# Patient Record
Sex: Female | Born: 1980 | Race: White | Hispanic: No | Marital: Married | Smoking: Never smoker
Health system: Southern US, Community
[De-identification: ages and names within clinical notes are randomized; demographics above are authoritative.]

## PROBLEM LIST (undated history)

## (undated) DIAGNOSIS — Z8619 Personal history of other infectious and parasitic diseases: Secondary | ICD-10-CM

## (undated) DIAGNOSIS — R51 Headache: Secondary | ICD-10-CM

## (undated) DIAGNOSIS — R519 Headache, unspecified: Secondary | ICD-10-CM

## (undated) HISTORY — DX: Personal history of other infectious and parasitic diseases: Z86.19

## (undated) HISTORY — DX: Headache: R51

## (undated) HISTORY — DX: Headache, unspecified: R51.9

## (undated) HISTORY — PX: EYE MUSCLE SURGERY: SHX370

## (undated) HISTORY — PX: WISDOM TOOTH EXTRACTION: SHX21

---

## 2014-01-16 LAB — OB RESULTS CONSOLE GC/CHLAMYDIA
CHLAMYDIA, DNA PROBE: NEGATIVE
Chlamydia: NEGATIVE
GC PROBE AMP, GENITAL: NEGATIVE
Gonorrhea: NEGATIVE

## 2014-01-16 LAB — OB RESULTS CONSOLE HIV ANTIBODY (ROUTINE TESTING)
HIV: NONREACTIVE
HIV: NONREACTIVE

## 2014-01-16 LAB — OB RESULTS CONSOLE RUBELLA ANTIBODY, IGM: Rubella: IMMUNE

## 2014-01-16 LAB — OB RESULTS CONSOLE RPR
RPR: NONREACTIVE
RPR: NONREACTIVE

## 2014-01-16 LAB — OB RESULTS CONSOLE ABO/RH: RH Type: POSITIVE

## 2014-01-16 LAB — OB RESULTS CONSOLE ANTIBODY SCREEN: Antibody Screen: NEGATIVE

## 2014-01-16 LAB — OB RESULTS CONSOLE HEPATITIS B SURFACE ANTIGEN: Hepatitis B Surface Ag: NEGATIVE

## 2014-04-17 NOTE — L&D Delivery Note (Signed)
SVD of VFI at 0656 on 07/28/14.  EBL 300cc.  APGARs 9,10.  Placenta to L&D. Head delivered LOA with loose nuchal x 2 reduced.  Body delivered atraumatically.  Cord clamped, cut and baby to abdomen.  Cord blood was obtained.  Placenta delivered S/I/3VC.  Fundus was firmed with pitocin and massage.  2nd degree perineal laceration repaired with 3-0 Rapide in the normal fashion.  Mom and baby stable.  Mitchel HonourMegan Christin Mccreedy, DO

## 2014-07-24 ENCOUNTER — Telehealth (HOSPITAL_COMMUNITY): Payer: Self-pay | Admitting: *Deleted

## 2014-07-24 ENCOUNTER — Encounter (HOSPITAL_COMMUNITY): Payer: Self-pay | Admitting: *Deleted

## 2014-07-24 NOTE — Telephone Encounter (Signed)
Preadmission screen  

## 2014-07-26 ENCOUNTER — Inpatient Hospital Stay (HOSPITAL_COMMUNITY): Admission: AD | Admit: 2014-07-26 | Payer: Self-pay | Source: Ambulatory Visit | Admitting: Obstetrics & Gynecology

## 2014-07-27 ENCOUNTER — Inpatient Hospital Stay (HOSPITAL_COMMUNITY): Payer: 59 | Admitting: Anesthesiology

## 2014-07-27 ENCOUNTER — Encounter (HOSPITAL_COMMUNITY): Payer: Self-pay

## 2014-07-27 ENCOUNTER — Inpatient Hospital Stay (HOSPITAL_COMMUNITY)
Admission: RE | Admit: 2014-07-27 | Discharge: 2014-07-29 | DRG: 775 | Disposition: A | Discharge status: Home / Self Care | Payer: 59 | Source: Ambulatory Visit | Attending: Obstetrics & Gynecology | Admitting: Obstetrics & Gynecology

## 2014-07-27 DIAGNOSIS — O480 Post-term pregnancy: Principal | ICD-10-CM | POA: Diagnosis present

## 2014-07-27 DIAGNOSIS — Z3403 Encounter for supervision of normal first pregnancy, third trimester: Secondary | ICD-10-CM | POA: Diagnosis present

## 2014-07-27 DIAGNOSIS — O09513 Supervision of elderly primigravida, third trimester: Secondary | ICD-10-CM | POA: Diagnosis not present

## 2014-07-27 DIAGNOSIS — Z3490 Encounter for supervision of normal pregnancy, unspecified, unspecified trimester: Secondary | ICD-10-CM

## 2014-07-27 DIAGNOSIS — Z3A40 40 weeks gestation of pregnancy: Secondary | ICD-10-CM | POA: Diagnosis present

## 2014-07-27 LAB — CBC
HCT: 35.2 % — ABNORMAL LOW (ref 36.0–46.0)
Hemoglobin: 12.1 g/dL (ref 12.0–15.0)
MCH: 27.4 pg (ref 26.0–34.0)
MCHC: 34.4 g/dL (ref 30.0–36.0)
MCV: 79.8 fL (ref 78.0–100.0)
PLATELETS: 218 10*3/uL (ref 150–400)
RBC: 4.41 MIL/uL (ref 3.87–5.11)
RDW: 15.1 % (ref 11.5–15.5)
WBC: 10 10*3/uL (ref 4.0–10.5)

## 2014-07-27 LAB — OB RESULTS CONSOLE GBS: STREP GROUP B AG: NEGATIVE

## 2014-07-27 LAB — TYPE AND SCREEN
ABO/RH(D): A POS
Antibody Screen: NEGATIVE

## 2014-07-27 LAB — ABO/RH: ABO/RH(D): A POS

## 2014-07-27 MED ORDER — LACTATED RINGERS IV SOLN
500.0000 mL | Freq: Once | INTRAVENOUS | Status: AC
  Administered 2014-07-27: 500 mL via INTRAVENOUS

## 2014-07-27 MED ORDER — PHENYLEPHRINE 40 MCG/ML (10ML) SYRINGE FOR IV PUSH (FOR BLOOD PRESSURE SUPPORT)
80.0000 ug | PREFILLED_SYRINGE | INTRAVENOUS | Status: DC | PRN
  Filled 2014-07-27: qty 2

## 2014-07-27 MED ORDER — OXYCODONE-ACETAMINOPHEN 5-325 MG PO TABS: 1.0000 | ORAL_TABLET | ORAL | Status: DC | PRN

## 2014-07-27 MED ORDER — FENTANYL 2.5 MCG/ML BUPIVACAINE 1/10 % EPIDURAL INFUSION (WH - ANES)
INTRAMUSCULAR | Status: DC | PRN
  Administered 2014-07-27: 15 mL/h via EPIDURAL

## 2014-07-27 MED ORDER — OXYTOCIN BOLUS FROM INFUSION
500.0000 mL | INTRAVENOUS | Status: DC
  Administered 2014-07-28: 500 mL via INTRAVENOUS

## 2014-07-27 MED ORDER — FLEET ENEMA 7-19 GM/118ML RE ENEM: 1.0000 | ENEMA | RECTAL | Status: DC | PRN

## 2014-07-27 MED ORDER — TERBUTALINE SULFATE 1 MG/ML IJ SOLN: 0.2500 mg | Freq: Once | INTRAMUSCULAR | Status: AC | PRN

## 2014-07-27 MED ORDER — LIDOCAINE HCL (PF) 1 % IJ SOLN
INTRAMUSCULAR | Status: DC | PRN
  Administered 2014-07-27: 4 mL
  Administered 2014-07-27: 5 mL

## 2014-07-27 MED ORDER — EPHEDRINE 5 MG/ML INJ
10.0000 mg | INTRAVENOUS | Status: DC | PRN
Start: 2014-07-27 — End: 2014-07-28
  Filled 2014-07-27: qty 2

## 2014-07-27 MED ORDER — OXYCODONE-ACETAMINOPHEN 5-325 MG PO TABS: 2.0000 | ORAL_TABLET | ORAL | Status: DC | PRN

## 2014-07-27 MED ORDER — OXYTOCIN 40 UNITS IN LACTATED RINGERS INFUSION - SIMPLE MED
1.0000 m[IU]/min | INTRAVENOUS | Status: DC
  Administered 2014-07-27: 16 m[IU]/min via INTRAVENOUS
  Administered 2014-07-27: 8 m[IU]/min via INTRAVENOUS
  Administered 2014-07-27: 14 m[IU]/min via INTRAVENOUS
  Administered 2014-07-27: 18 m[IU]/min via INTRAVENOUS
  Administered 2014-07-27: 2 m[IU]/min via INTRAVENOUS
  Administered 2014-07-27: 10 m[IU]/min via INTRAVENOUS
  Administered 2014-07-27: 12 m[IU]/min via INTRAVENOUS
  Administered 2014-07-27: 4 m[IU]/min via INTRAVENOUS
  Administered 2014-07-27: 6 m[IU]/min via INTRAVENOUS
  Administered 2014-07-27: 20 m[IU]/min via INTRAVENOUS
  Filled 2014-07-27: qty 1000

## 2014-07-27 MED ORDER — LIDOCAINE HCL (PF) 1 % IJ SOLN
30.0000 mL | INTRAMUSCULAR | Status: DC | PRN
  Filled 2014-07-27: qty 30

## 2014-07-27 MED ORDER — ONDANSETRON HCL 4 MG/2ML IJ SOLN
4.0000 mg | Freq: Four times a day (QID) | INTRAMUSCULAR | Status: DC | PRN
  Administered 2014-07-27: 4 mg via INTRAVENOUS
  Filled 2014-07-27: qty 2

## 2014-07-27 MED ORDER — FENTANYL 2.5 MCG/ML BUPIVACAINE 1/10 % EPIDURAL INFUSION (WH - ANES)
14.0000 mL/h | INTRAMUSCULAR | Status: DC | PRN
  Administered 2014-07-28: 14 mL/h via EPIDURAL
  Filled 2014-07-27 (×2): qty 125

## 2014-07-27 MED ORDER — CITRIC ACID-SODIUM CITRATE 334-500 MG/5ML PO SOLN: 30.0000 mL | ORAL | Status: DC | PRN

## 2014-07-27 MED ORDER — DIPHENHYDRAMINE HCL 50 MG/ML IJ SOLN: 12.5000 mg | INTRAMUSCULAR | Status: DC | PRN

## 2014-07-27 MED ORDER — LACTATED RINGERS IV SOLN
INTRAVENOUS | Status: DC
  Administered 2014-07-27: 11:00:00 via INTRAVENOUS

## 2014-07-27 MED ORDER — LACTATED RINGERS IV SOLN
500.0000 mL | INTRAVENOUS | Status: DC | PRN
  Administered 2014-07-28: 500 mL via INTRAVENOUS

## 2014-07-27 MED ORDER — OXYTOCIN 40 UNITS IN LACTATED RINGERS INFUSION - SIMPLE MED: 62.5000 mL/h | INTRAVENOUS | Status: DC

## 2014-07-27 MED ORDER — ACETAMINOPHEN 325 MG PO TABS
650.0000 mg | ORAL_TABLET | ORAL | Status: DC | PRN
  Administered 2014-07-27: 650 mg via ORAL
  Filled 2014-07-27: qty 2

## 2014-07-27 MED ORDER — EPHEDRINE 5 MG/ML INJ
10.0000 mg | INTRAVENOUS | Status: DC | PRN
  Filled 2014-07-27: qty 2

## 2014-07-27 MED ORDER — PHENYLEPHRINE 40 MCG/ML (10ML) SYRINGE FOR IV PUSH (FOR BLOOD PRESSURE SUPPORT)
80.0000 ug | PREFILLED_SYRINGE | INTRAVENOUS | Status: DC | PRN
  Filled 2014-07-27: qty 20
  Filled 2014-07-27: qty 2

## 2014-07-27 NOTE — H&P (Signed)
Claire PriestLauren E Butler is a 34 y.o. female presenting for post-dates induction.  Patient has had uncomplicated antepartum course.  AMA with negative NIPT. GBS negative.  Maternal Medical History:  Fetal activity: Perceived fetal activity is normal.   Last perceived fetal movement was within the past hour.    Prenatal complications: no prenatal complications Prenatal Complications - Diabetes: none.    OB History    Gravida Para Term Preterm AB TAB SAB Ectopic Multiple Living   1              Past Medical History  Diagnosis Date  . Headache   . Hx of varicella    Past Surgical History  Procedure Laterality Date  . No past surgeries     Family History: family history includes Cancer in her maternal grandmother and paternal grandmother; Depression in her sister. Social History:  reports that she has never smoked. She has never used smokeless tobacco. She reports that she does not drink alcohol or use illicit drugs.   Prenatal Transfer Tool  Maternal Diabetes: No Genetic Screening: Normal Maternal Ultrasounds/Referrals: Normal Fetal Ultrasounds or other Referrals:  None Maternal Substance Abuse:  No Significant Maternal Medications:  None Significant Maternal Lab Results:  Lab values include: Group B Strep negative Other Comments:  None  ROS    There were no vitals taken for this visit. Maternal Exam:  Uterine Assessment: Contraction strength is mild.  Contraction frequency is rare.   Abdomen: Patient reports no abdominal tenderness. Fundal height is c/w dates.   Estimated fetal weight is 8#4.       Physical Exam  Constitutional: She is oriented to person, place, and time. She appears well-developed and well-nourished.  GI: Soft. There is no rebound and no guarding.  Neurological: She is alert and oriented to person, place, and time.  Skin: Skin is warm and dry.  Psychiatric: She has a normal mood and affect. Her behavior is normal.    Prenatal labs: ABO, Rh:  A/Positive/-- (10/02 0000) Antibody: Negative (10/02 0000) Rubella: Immune (10/02 0000) RPR: Nonreactive, Nonreactive (10/02 0000)  HBsAg: Negative (10/02 0000)  HIV: Non-reactive, Non-reactive (10/02 0000)  GBS:     Assessment/Plan: 34yo G1 at 5743w1d for post-dates induction -Plan pitocin, AROM -Epidural when ready -Anticipate NSVD   Alexsandro Salek 07/27/2014, 8:28 AM

## 2014-07-27 NOTE — Anesthesia Procedure Notes (Addendum)
Epidural Patient location during procedure: OB Start time: 07/27/2014 8:16 PM  Staffing Anesthesiologist: Mal AmabileFOSTER, Tirth Cothron Performed by: anesthesiologist   Preanesthetic Checklist Completed: patient identified, site marked, surgical consent, pre-op evaluation, timeout performed, IV checked, risks and benefits discussed and monitors and equipment checked  Epidural Patient position: sitting Prep: site prepped and draped and DuraPrep Patient monitoring: continuous pulse ox and blood pressure Approach: midline Location: L3-L4 Injection technique: LOR air  Needle:  Needle type: Tuohy  Needle gauge: 17 G Needle length: 9 cm and 9 Needle insertion depth: 5 cm cm Catheter type: closed end flexible Catheter size: 19 Gauge Catheter at skin depth: 10 cm Test dose: negative and Other  Assessment Events: blood not aspirated, injection not painful, no injection resistance, negative IV test and no paresthesia  Additional Notes Patient identified. Risks and benefits discussed including failed block, incomplete  Pain control, post dural puncture headache, nerve damage, paralysis, blood pressure Changes, nausea, vomiting, reactions to medications-both toxic and allergic and post Partum back pain. All questions were answered. Patient expressed understanding and wished to proceed. Sterile technique was used throughout procedure. Epidural site was Dressed with sterile barrier dressing. No paresthesias, signs of intravascular injection Or signs of intrathecal spread were encountered.  Patient was more comfortable after the epidural was dosed. Please see RN's note for documentation of vital signs and FHR which are stable.

## 2014-07-27 NOTE — Anesthesia Preprocedure Evaluation (Signed)
Anesthesia Evaluation  Patient identified by MRN, date of birth, ID band Patient awake    Reviewed: Allergy & Precautions, H&P , Patient's Chart, lab work & pertinent test results  Airway Mallampati: II  TM Distance: >3 FB Neck ROM: full    Dental no notable dental hx. (+) Teeth Intact   Pulmonary neg pulmonary ROS,  breath sounds clear to auscultation  Pulmonary exam normal       Cardiovascular negative cardio ROS  Rhythm:regular Rate:Normal     Neuro/Psych  Headaches, negative psych ROS   GI/Hepatic negative GI ROS, Neg liver ROS,   Endo/Other  negative endocrine ROS  Renal/GU negative Renal ROS  negative genitourinary   Musculoskeletal   Abdominal Normal abdominal exam  (+)   Peds  Hematology negative hematology ROS (+)   Anesthesia Other Findings   Reproductive/Obstetrics (+) Pregnancy                             Anesthesia Physical Anesthesia Plan  ASA: II  Anesthesia Plan: Epidural   Post-op Pain Management:    Induction:   Airway Management Planned:   Additional Equipment:   Intra-op Plan:   Post-operative Plan:   Informed Consent: I have reviewed the patients History and Physical, chart, labs and discussed the procedure including the risks, benefits and alternatives for the proposed anesthesia with the patient or authorized representative who has indicated his/her understanding and acceptance.     Plan Discussed with: Anesthesiologist  Anesthesia Plan Comments:         Anesthesia Quick Evaluation

## 2014-07-28 ENCOUNTER — Encounter (HOSPITAL_COMMUNITY): Payer: Self-pay

## 2014-07-28 LAB — RPR: RPR Ser Ql: NONREACTIVE

## 2014-07-28 MED ORDER — ACETAMINOPHEN 325 MG PO TABS
650.0000 mg | ORAL_TABLET | ORAL | Status: DC | PRN
  Administered 2014-07-28 (×3): 650 mg via ORAL
  Filled 2014-07-28 (×3): qty 2

## 2014-07-28 MED ORDER — ONDANSETRON HCL 4 MG/2ML IJ SOLN: 4.0000 mg | INTRAMUSCULAR | Status: DC | PRN

## 2014-07-28 MED ORDER — DIPHENHYDRAMINE HCL 25 MG PO CAPS: 25.0000 mg | ORAL_CAPSULE | Freq: Four times a day (QID) | ORAL | Status: DC | PRN

## 2014-07-28 MED ORDER — OXYCODONE-ACETAMINOPHEN 5-325 MG PO TABS: 2.0000 | ORAL_TABLET | ORAL | Status: DC | PRN

## 2014-07-28 MED ORDER — SENNOSIDES-DOCUSATE SODIUM 8.6-50 MG PO TABS
2.0000 | ORAL_TABLET | ORAL | Status: DC
  Administered 2014-07-28: 2 via ORAL
  Filled 2014-07-28: qty 2

## 2014-07-28 MED ORDER — WITCH HAZEL-GLYCERIN EX PADS: 1.0000 "application " | MEDICATED_PAD | CUTANEOUS | Status: DC | PRN

## 2014-07-28 MED ORDER — ONDANSETRON HCL 4 MG PO TABS: 4.0000 mg | ORAL_TABLET | ORAL | Status: DC | PRN

## 2014-07-28 MED ORDER — ZOLPIDEM TARTRATE 5 MG PO TABS: 5.0000 mg | ORAL_TABLET | Freq: Every evening | ORAL | Status: DC | PRN

## 2014-07-28 MED ORDER — IBUPROFEN 600 MG PO TABS
600.0000 mg | ORAL_TABLET | Freq: Four times a day (QID) | ORAL | Status: DC
  Administered 2014-07-28 – 2014-07-29 (×6): 600 mg via ORAL
  Filled 2014-07-28 (×5): qty 1

## 2014-07-28 MED ORDER — SIMETHICONE 80 MG PO CHEW: 80.0000 mg | CHEWABLE_TABLET | ORAL | Status: DC | PRN

## 2014-07-28 MED ORDER — DIBUCAINE 1 % RE OINT: 1.0000 "application " | TOPICAL_OINTMENT | RECTAL | Status: DC | PRN

## 2014-07-28 MED ORDER — LANOLIN HYDROUS EX OINT: TOPICAL_OINTMENT | CUTANEOUS | Status: DC | PRN

## 2014-07-28 MED ORDER — OXYCODONE-ACETAMINOPHEN 5-325 MG PO TABS
1.0000 | ORAL_TABLET | ORAL | Status: DC | PRN
  Filled 2014-07-28: qty 1

## 2014-07-28 MED ORDER — TETANUS-DIPHTH-ACELL PERTUSSIS 5-2.5-18.5 LF-MCG/0.5 IM SUSP: 0.5000 mL | Freq: Once | INTRAMUSCULAR | Status: DC

## 2014-07-28 MED ORDER — PRENATAL MULTIVITAMIN CH
1.0000 | ORAL_TABLET | Freq: Every day | ORAL | Status: DC
  Administered 2014-07-28 – 2014-07-29 (×2): 1 via ORAL
  Filled 2014-07-28 (×2): qty 1

## 2014-07-28 MED ORDER — BENZOCAINE-MENTHOL 20-0.5 % EX AERO
1.0000 "application " | INHALATION_SPRAY | CUTANEOUS | Status: DC | PRN
  Filled 2014-07-28: qty 56

## 2014-07-28 NOTE — Progress Notes (Signed)
Reported SVE to Dr. Langston MaskerMorris. Dr. Langston MaskerMorris said to labor down once complete if patient is comfortable and to notify her when we start pushing.

## 2014-07-28 NOTE — Anesthesia Postprocedure Evaluation (Signed)
Anesthesia Post Note  Patient: Claire Butler  Procedure(s) Performed: * No procedures listed *  Anesthesia type: Epidural  Patient location: Mother/Baby  Post pain: Pain level controlled  Post assessment: Post-op Vital signs reviewed  Last Vitals:  Filed Vitals:   07/28/14 1211  BP: 113/74  Pulse: 96  Temp: 36.8 C  Resp: 18    Post vital signs: Reviewed  Level of consciousness:alert  Complications: No apparent anesthesia complications

## 2014-07-28 NOTE — Plan of Care (Signed)
Problem: Phase II Progression Outcomes Goal: Initiate breastfeeding within 1hr delivery Outcome: Not Applicable Date Met:  43/15/40 Patient wants to bottle feed

## 2014-07-29 LAB — CBC
HEMATOCRIT: 24.4 % — AB (ref 36.0–46.0)
Hemoglobin: 8.4 g/dL — ABNORMAL LOW (ref 12.0–15.0)
MCH: 27.5 pg (ref 26.0–34.0)
MCHC: 34.4 g/dL (ref 30.0–36.0)
MCV: 79.7 fL (ref 78.0–100.0)
Platelets: 176 10*3/uL (ref 150–400)
RBC: 3.06 MIL/uL — ABNORMAL LOW (ref 3.87–5.11)
RDW: 15.3 % (ref 11.5–15.5)
WBC: 12.7 10*3/uL — AB (ref 4.0–10.5)

## 2014-07-29 MED ORDER — IBUPROFEN 600 MG PO TABS: 600.0000 mg | ORAL_TABLET | Freq: Four times a day (QID) | ORAL | Status: DC

## 2014-07-29 NOTE — Discharge Summary (Signed)
Obstetric Discharge Summary Reason for Admission: induction of labor Prenatal Procedures: ultrasound Intrapartum Procedures: spontaneous vaginal delivery Postpartum Procedures: none Complications-Operative and Postpartum: 2 degree perineal laceration HEMOGLOBIN  Date Value Ref Range Status  07/29/2014 8.4* 12.0 - 15.0 g/dL Final    Comment:    DELTA CHECK NOTED REPEATED TO VERIFY    HCT  Date Value Ref Range Status  07/29/2014 24.4* 36.0 - 46.0 % Final    Physical Exam:  General: alert and cooperative Lochia: appropriate Uterine Fundus: firm Incision: healing well DVT Evaluation: No evidence of DVT seen on physical exam. Negative Homan's sign. No cords or calf tenderness. No significant calf/ankle edema.  Discharge Diagnoses: Term Pregnancy-delivered  Discharge Information: Date: 07/29/2014 Activity: pelvic rest Diet: routine Medications: PNV and Ibuprofen Condition: stable Instructions: refer to practice specific booklet Discharge to: home   Newborn Data: Live born female  Birth Weight: 7 lb 9 oz (3430 g) APGAR: 9, 10  Home with mother.  Claire Butler G 07/29/2014, 8:09 AM

## 2014-07-30 ENCOUNTER — Inpatient Hospital Stay (HOSPITAL_COMMUNITY)
Admission: AD | Admit: 2014-07-30 | Discharge: 2014-07-30 | Disposition: A | Discharge status: Home / Self Care | Payer: 59 | Source: Ambulatory Visit | Attending: Obstetrics and Gynecology | Admitting: Obstetrics and Gynecology

## 2014-07-30 DIAGNOSIS — R42 Dizziness and giddiness: Secondary | ICD-10-CM | POA: Diagnosis present

## 2014-07-30 DIAGNOSIS — D62 Acute posthemorrhagic anemia: Secondary | ICD-10-CM

## 2014-07-30 DIAGNOSIS — O9081 Anemia of the puerperium: Secondary | ICD-10-CM | POA: Insufficient documentation

## 2014-07-30 LAB — CBC WITH DIFFERENTIAL/PLATELET
Basophils Absolute: 0 10*3/uL (ref 0.0–0.1)
Basophils Relative: 0 % (ref 0–1)
EOS ABS: 0.1 10*3/uL (ref 0.0–0.7)
EOS PCT: 1 % (ref 0–5)
HCT: 26.5 % — ABNORMAL LOW (ref 36.0–46.0)
Hemoglobin: 8.8 g/dL — ABNORMAL LOW (ref 12.0–15.0)
Lymphocytes Relative: 18 % (ref 12–46)
Lymphs Abs: 2.2 10*3/uL (ref 0.7–4.0)
MCH: 26.8 pg (ref 26.0–34.0)
MCHC: 33.2 g/dL (ref 30.0–36.0)
MCV: 80.8 fL (ref 78.0–100.0)
Monocytes Absolute: 0.5 10*3/uL (ref 0.1–1.0)
Monocytes Relative: 5 % (ref 3–12)
NEUTROS ABS: 9.1 10*3/uL — AB (ref 1.7–7.7)
NEUTROS PCT: 76 % (ref 43–77)
Platelets: 243 10*3/uL (ref 150–400)
RBC: 3.28 MIL/uL — ABNORMAL LOW (ref 3.87–5.11)
RDW: 15.4 % (ref 11.5–15.5)
WBC: 12 10*3/uL — ABNORMAL HIGH (ref 4.0–10.5)

## 2014-07-30 NOTE — Discharge Instructions (Signed)
Iron Deficiency Anemia °Anemia is when you have a low number of healthy red blood cells. It is often caused by too little iron. This is called iron deficiency anemia. It may make you tired and short of breath. °HOME CARE  °· Take iron as told by your doctor. °· Take vitamins as told by your doctor. °· Eat foods that have iron in them. This includes liver, lean beef, whole-grain bread, eggs, dried fruit, and dark green leafy vegetables. °GET HELP RIGHT AWAY IF: °· You pass out (faint). °· You have chest pain. °· You feel sick to your stomach (nauseous) or throw up (vomit). °· You get very short of breath with activity. °· You are weak. °· You have a fast heartbeat. °· You start to sweat for no reason. °· You become light-headed when getting up from a chair or bed. °MAKE SURE YOU: °· Understand these instructions. °· Will watch your condition. °· Will get help right away if you are not doing well or get worse. °Document Released: 05/06/2010 Document Revised: 04/08/2013 Document Reviewed: 12/09/2012 °ExitCare® Patient Information ©2015 [employer]. This information is not intended to replace advice given to you by your health care provider. Make sure you discuss any questions you have with your health care provider. ° °

## 2014-07-30 NOTE — MAU Note (Signed)
Pt here for blood work on her baby. Mentioned she was feeling dizzy to phlebotomist. Pt thought she might need her iron rechecked. Sent w/c to lab and pt signed in.

## 2014-07-30 NOTE — MAU Provider Note (Signed)
History     CSN: [id number]  Arrival date and time: 07/30/14 1329   First Provider Initiated Contact with Patient 07/30/14 1439      Chief Complaint  Patient presents with  . Dizziness   HPI Claire Butler is a 34 y.o. G1P1001 who delivered 07/28/14 and presents to MAU today with complaint of dizziness. She states that [facility] told that Hgb was low after delivery and she would need iron supplements. She was here for labs for the baby this afternoon and felt dizzy while in the lab. The lab provider advised that she should be seen for this. The patient denies syncopal episodes or LOC.   OB History    Gravida Para Term Preterm AB TAB SAB Ectopic Multiple Living   1 1 1       0 1      Past Medical History  Diagnosis Date  . Headache   . Hx of varicella     Past Surgical History  Procedure Laterality Date  . Eye muscle surgery    . Wisdom tooth extraction      Family History  Problem Relation Age of Onset  . Depression Sister   . Cancer Maternal Grandmother     breast  . Cancer Paternal Grandmother     lung    History  Substance Use Topics  . Smoking status: Never Smoker   . Smokeless tobacco: Never Used  . Alcohol Use: No    Allergies: No Known Allergies  Prescriptions prior to admission  Medication Sig Dispense Refill Last Dose  . ibuprofen (ADVIL,MOTRIN) 600 MG tablet Take 1 tablet (600 mg total) by mouth every 6 (six) hours. 30 tablet 1   . Prenatal Vit-Fe Fumarate-FA (PRENATAL MULTIVITAMIN) TABS tablet Take 1 tablet by mouth daily at 12 noon.   07/26/2014 at Unknown time    Review of Systems  Constitutional: Positive for malaise/fatigue. Negative for fever and chills.  Genitourinary:       + vaginal bleeding  Neurological: Positive for dizziness. Negative for loss of consciousness, weakness and headaches.   Physical Exam   SpO2 100 %, unknown if currently breastfeeding.  Physical Exam  Constitutional: She is oriented  to person, place, and time. She appears well-developed and well-nourished. No distress.  HENT:  Head: Normocephalic.  Cardiovascular: Normal rate.   Respiratory: Effort normal.  Neurological: She is alert and oriented to person, place, and time.  Skin: Skin is warm and dry. No erythema. There is pallor.  Psychiatric: She has a normal mood and affect.   Results for orders placed or performed during the hospital encounter of 07/30/14 (from the past 24 hour(s))  CBC with Differential/Platelet     Status: Abnormal   Collection Time: 07/30/14  1:40 PM  Result Value Ref Range   WBC 12.0 (H) 4.0 - 10.5 K/uL   RBC 3.28 (L) 3.87 - 5.11 MIL/uL   Hemoglobin 8.8 (L) 12.0 - 15.0 g/dL   HCT 40.926.5 (L) 81.136.0 - 91.446.0 %   MCV 80.8 78.0 - 100.0 fL   MCH 26.8 26.0 - 34.0 pg   MCHC 33.2 30.0 - 36.0 g/dL   RDW 78.215.4 95.611.5 - 21.315.5 %   Platelets 243 150 - 400 K/uL   Neutrophils Relative % 76 43 - 77 %   Neutro Abs 9.1 (H) 1.7 - 7.7 K/uL   Lymphocytes Relative 18 12 - 46 %   Lymphs Abs 2.2 0.7 - 4.0 K/uL  Monocytes Relative 5 3 - 12 %   Monocytes Absolute 0.5 0.1 - 1.0 K/uL   Eosinophils Relative 1 0 - 5 %   Eosinophils Absolute 0.1 0.0 - 0.7 K/uL   Basophils Relative 0 0 - 1 %   Basophils Absolute 0.0 0.0 - 0.1 K/uL    Orthostatic VS for the past 24 hrs:  BP- Lying Pulse- Lying BP- Sitting Pulse- Sitting BP- Standing at 0 minutes Pulse- Standing at 0 minutes  07/30/14 1419 135/76 mmHg 96 126/72 mmHg 97 121/79 mmHg 110      MAU Course  Procedures None  MDM Discussed patient with Dr. Rana Snare. Advised that if orthostatics and CBC are stable patient may be discharged to follow-up in the office.  Patient is hemodynamically stable.  Assessment and Plan  A: PPD #2 s/p SVD Anemia  P: Discharge home Patient advised to start taking OTC iron supplements Discussed common side effects of constipation Bleeding precautions discussed Patient advised to follow-up with [facility] as scheduled or  sooner PRN Patient may return to MAU as needed or if her condition were to change or worsen   Marny Lowenstein, PA-C  07/30/2014, 2:47 PM

## 2015-02-08 ENCOUNTER — Encounter: Payer: Self-pay | Admitting: *Deleted

## 2015-02-08 ENCOUNTER — Emergency Department (INDEPENDENT_AMBULATORY_CARE_PROVIDER_SITE_OTHER)
Admission: EM | Admit: 2015-02-08 | Discharge: 2015-02-08 | Disposition: A | Discharge status: Home / Self Care | Payer: 59 | Source: Home / Self Care | Attending: Family Medicine | Admitting: Family Medicine

## 2015-02-08 DIAGNOSIS — L230 Allergic contact dermatitis due to metals: Secondary | ICD-10-CM

## 2015-02-08 MED ORDER — TRIAMCINOLONE ACETONIDE 0.5 % EX CREA: 1.0000 "application " | TOPICAL_CREAM | Freq: Three times a day (TID) | CUTANEOUS | Status: DC

## 2015-02-08 NOTE — Discharge Instructions (Signed)
Avoid using present watch.   Contact Dermatitis Dermatitis is redness, soreness, and swelling (inflammation) of the skin. Contact dermatitis is a reaction to certain substances that touch the skin. There are two types of contact dermatitis:   Irritant contact dermatitis. This type is caused by something that irritates your skin, such as dry hands from washing them too much. This type does not require previous exposure to the substance for a reaction to occur. This type is more common.  Allergic contact dermatitis. This type is caused by a substance that you are allergic to, such as a nickel allergy or poison ivy. This type only occurs if you have been exposed to the substance (allergen) before. Upon a repeat exposure, your body reacts to the substance. This type is less common. CAUSES  Many different substances can cause contact dermatitis. Irritant contact dermatitis is most commonly caused by exposure to:   Makeup.   Soaps.   Detergents.   Bleaches.   Acids.   Metal salts, such as nickel.  Allergic contact dermatitis is most commonly caused by exposure to:   Poisonous plants.   Chemicals.   Jewelry.   Latex.   Medicines.   Preservatives in products, such as clothing.  RISK FACTORS This condition is more likely to develop in:   People who have jobs that expose them to irritants or allergens.  People who have certain medical conditions, such as asthma or eczema.  SYMPTOMS  Symptoms of this condition may occur anywhere on your body where the irritant has touched you or is touched by you. Symptoms include:  Dryness or flaking.   Redness.   Cracks.   Itching.   Pain or a burning feeling.   Blisters.  Drainage of small amounts of blood or clear fluid from skin cracks. With allergic contact dermatitis, there may also be swelling in areas such as the eyelids, mouth, or genitals.  DIAGNOSIS  This condition is diagnosed with a medical history and  physical exam. A patch skin test may be performed to help determine the cause. If the condition is related to your job, you may need to see an occupational medicine specialist. TREATMENT Treatment for this condition includes figuring out what caused the reaction and protecting your skin from further contact. Treatment may also include:   Steroid creams or ointments. Oral steroid medicines may be needed in more severe cases.  Antibiotics or antibacterial ointments, if a skin infection is present.  Antihistamine lotion or an antihistamine taken by mouth to ease itching.  A bandage (dressing). HOME CARE INSTRUCTIONS Skin Care  Moisturize your skin as needed.   Apply cool compresses to the affected areas.  Try taking a bath with:  Epsom salts. Follow the instructions on the packaging. You can get these at your local pharmacy or grocery store.  Baking soda. Pour a small amount into the bath as directed by your health care provider.  Colloidal oatmeal. Follow the instructions on the packaging. You can get this at your local pharmacy or grocery store.  Try applying baking soda paste to your skin. Stir water into baking soda until it reaches a paste-like consistency.  Do not scratch your skin.  Bathe less frequently, such as every other day.  Bathe in lukewarm water. Avoid using hot water. Medicines  Take or apply over-the-counter and prescription medicines only as told by your health care provider.   If you were prescribed an antibiotic medicine, take or apply your antibiotic as told by your health care  provider. Do not stop using the antibiotic even if your condition starts to improve. General Instructions  Keep all follow-up visits as told by your health care provider. This is important.  Avoid the substance that caused your reaction. If you do not know what caused it, keep a journal to try to track what caused it. Write down:  What you eat.  What cosmetic products you  use.  What you drink.  What you wear in the affected area. This includes jewelry.  If you were given a dressing, take care of it as told by your health care provider. This includes when to change and remove it. SEEK MEDICAL CARE IF:   Your condition does not improve with treatment.  Your condition gets worse.  You have signs of infection such as swelling, tenderness, redness, soreness, or warmth in the affected area.  You have a fever.  You have new symptoms. SEEK IMMEDIATE MEDICAL CARE IF:   You have a severe headache, neck pain, or neck stiffness.  You vomit.  You feel very sleepy.  You notice red streaks coming from the affected area.  Your bone or joint underneath the affected area becomes painful after the skin has healed.  The affected area turns darker.  You have difficulty breathing.   This information is not intended to replace advice given to you by your health care provider. Make sure you discuss any questions you have with your health care provider.   Document Released: 03/31/2000 Document Revised: 12/23/2014 Document Reviewed: 08/19/2014 Elsevier Interactive Patient Education [employer]

## 2015-02-08 NOTE — ED Provider Notes (Signed)
CSN: [id number]     Arrival date & time 02/08/15  1057 History   First MD Initiated Contact with Patient 02/08/15 1119     Chief Complaint  Patient presents with  . Rash      HPI Comments: Eight months ago patient developed a mild rash on her left wrist underneath her watch, which has a leather band.  She has tried using the watch intermittently but rash has not improved and has not responded to HC cream, anti-fungal cream, Neosporin cream, and hydrogen peroxide.  Patient is a 34 y.o. female presenting with rash. The history is provided by the patient.  Rash Location: left wrist. Quality: burning, dryness, itchiness and redness   Quality: not blistering, not draining, not painful, not peeling, not scaling, not swelling and not weeping   Severity:  Mild Onset quality:  Gradual Duration:  8 months Timing:  Constant Progression:  Unchanged Context comment:  Watch and watch band Relieved by:  Nothing Exacerbated by: warm water. Ineffective treatments:  Anti-fungal cream, antibiotic cream, anti-itch cream and topical steroids Associated symptoms: no fever     Past Medical History  Diagnosis Date  . Headache   . Hx of varicella    Past Surgical History  Procedure Laterality Date  . Eye muscle surgery    . Wisdom tooth extraction     Family History  Problem Relation Age of Onset  . Depression Sister   . Cancer Maternal Grandmother     breast  . Cancer Paternal Grandmother     lung   Social History  Substance Use Topics  . Smoking status: Never Smoker   . Smokeless tobacco: Never Used  . Alcohol Use: No   OB History    Gravida Para Term Preterm AB TAB SAB Ectopic Multiple Living   0 1     Review of Systems  Constitutional: Negative for fever.  Skin: Positive for rash.  All other systems reviewed and are negative.   Allergies  Review of patient's allergies indicates no known allergies.  Home Medications   Prior to Admission medications     Medication Sig Start Date End Date Taking? Authorizing Provider  Loratadine (CLARITIN PO) Take by mouth.   Yes Historical Provider, MD  triamcinolone cream (KENALOG) 0.5 % Apply 1 application topically 3 (three) times daily. 02/08/15   Lattie Haw, MD   Meds Ordered and Administered this Visit  Medications - No data to display  BP 112/73 mmHg  Pulse 62  Temp(Src) 98.6 F (37 C) (Oral)  Ht  (1.778 m)  Wt 178 lb (80.74 kg)  BMI 25.54 kg/m2  SpO2 100%  LMP 01/16/2015 No data found.   Physical Exam  Constitutional: She appears well-developed and well-nourished. No distress.  HENT:  Head: Normocephalic.  Mouth/Throat: Oropharynx is clear and moist.  Eyes: Pupils are equal, round, and reactive to light.  Lymphadenopathy:    She has no cervical adenopathy.  Neurological: She is alert.  Skin: Skin is warm and dry.     Left wrist has a pink macular eczematous appearing eruption with a rectangular outline.  No swelling or tenderness to palpation.  Nursing note and vitals reviewed.   ED Course  Procedures     MDM   1. Contact dermatitis due to metal (or leather)    Begin triamcinolone cream 0.5% TID Avoid using present watch. Followup with dermatologist if not improved two weeks.    Tera Mater  Cathren HarshBeese, MD 02/14/15 1026

## 2015-02-08 NOTE — ED Notes (Signed)
Pt reports a rash to her left wrist x 8 months where she wore a watch for a long period of time. She has tried OTC anti-fungal cream, neosporin, peroxide without relief.

## 2015-02-14 ENCOUNTER — Telehealth: Payer: Self-pay | Admitting: Emergency Medicine

## 2015-10-08 ENCOUNTER — Encounter: Payer: Self-pay | Admitting: *Deleted

## 2015-10-08 ENCOUNTER — Emergency Department (INDEPENDENT_AMBULATORY_CARE_PROVIDER_SITE_OTHER)
Admission: EM | Admit: 2015-10-08 | Discharge: 2015-10-08 | Disposition: A | Discharge status: Home / Self Care | Payer: BLUE CROSS/BLUE SHIELD | Source: Home / Self Care

## 2015-10-08 DIAGNOSIS — J069 Acute upper respiratory infection, unspecified: Secondary | ICD-10-CM

## 2015-10-08 DIAGNOSIS — B9789 Other viral agents as the cause of diseases classified elsewhere: Principal | ICD-10-CM

## 2015-10-08 MED ORDER — AMOXICILLIN 875 MG PO TABS: 875.0000 mg | ORAL_TABLET | Freq: Two times a day (BID) | ORAL | Status: DC

## 2015-10-08 NOTE — ED Provider Notes (Signed)
CSN: [id number]     Arrival date & time 10/08/15  1445 History   None    Chief Complaint  Patient presents with  . Sinus Problem  . Cough      HPI Comments: Patient reports that she developed a cold-like illness about one month ago and seemed to improve.  About 1.5 weeks later she developed GI virus with nausea/vomiting and diarrhea.  These symptoms improved after about 5 days.  About 5 days ago she developed a sore throat, hoarseness, increased sinus congestion, occasional cough and fatigue.  No fevers, chills, and sweats.  She is currently trying to conceive and is mid-cycle.  The history is provided by the patient.    Past Medical History  Diagnosis Date  . Headache   . Hx of varicella    Past Surgical History  Procedure Laterality Date  . Eye muscle surgery    . Wisdom tooth extraction     Family History  Problem Relation Age of Onset  . Depression Sister   . Cancer Maternal Grandmother     breast  . Cancer Paternal Grandmother     lung   Social History  Substance Use Topics  . Smoking status: Never Smoker   . Smokeless tobacco: Never Used  . Alcohol Use: No   OB History    Gravida Para Term Preterm AB TAB SAB Ectopic Multiple Living   1 1 1       0 1     Review of Systems + sore throat + cough + hoarse No pleuritic pain No wheezing + nasal congestion + post-nasal drainage + sinus pain/pressure No itchy/red eyes No earache No hemoptysis No SOB No fever/chills No nausea No vomiting No abdominal pain No diarrhea No urinary symptoms No skin rash + fatigue No myalgias + headache Used OTC meds without relief  Allergies  Review of patient's allergies indicates no known allergies.  Home Medications   Prior to Admission medications   Medication Sig Start Date End Date Taking? Authorizing Provider  clomiPHENE (CLOMID) 50 MG tablet Take by mouth daily.   Yes Historical Provider, MD  Prenat-FeFum-FePo-FA-Omega 3 (CONCEPT DHA PO) Take by mouth.   Yes  Historical Provider, MD  amoxicillin (AMOXIL) 875 MG tablet Take 1 tablet (875 mg total) by mouth 2 (two) times daily. (Rx void after 10/16/15) 10/08/15   Lattie HawStephen A Dudley Mages, MD   Meds Ordered and Administered this Visit  Medications - No data to display  BP 108/72 mmHg  Pulse 82  Temp(Src) 98.6 F (37 C) (Oral)  Resp 16  Wt 172 lb (78.019 kg)  SpO2 99%  LMP 09/22/2015 No data found.   Physical Exam Nursing notes and Vital Signs reviewed. Appearance:  Patient appears stated age, and in no acute distress Eyes:  Pupils are equal, round, and reactive to light and accomodation.  Extraocular movement is intact.  Conjunctivae are not inflamed  Ears:  Canals normal.  Tympanic membranes normal.  Nose:  Mildly congested turbinates.  No sinus tenderness.   Pharynx:  Normal Neck:  Supple.  Tender enlarged posterior/lateral nodes are palpated bilaterally  Lungs:  Clear to auscultation.  Breath sounds are equal.  Moving air well. Heart:  Regular rate and rhythm without murmurs, rubs, or gallops.  Abdomen:  Nontender without masses or hepatosplenomegaly.  Bowel sounds are present.  No CVA or flank tenderness.  Extremities:  No edema.  Skin:  No rash present.   ED Course  Procedures none  MDM  1. Viral URI with cough    There is no evidence of bacterial infection today.   Take plain guaifenesin (1200mg  extended release tabs such as Mucinex) twice daily, with plenty of water, for cough and congestion.  May add Pseudoephedrine (30mg , one or two every 4 to 6 hours) for sinus congestion.  Get adequate rest.   May use Afrin nasal spray (or generic oxymetazoline) twice daily for about 5 days and then discontinue.  Also recommend using saline nasal spray several times daily and saline nasal irrigation (AYR is a common brand).  Use Flonase nasal spray each morning after using Afrin nasal spray and saline nasal irrigation. Try warm salt water gargles for sore throat.  May take Delsym Cough Suppressant at  bedtime for nighttime cough.  Stop all antihistamines for now, and other non-prescription cough/cold preparations. Begin Amoxicillin if not improving about 5 days or if persistent fever develops (Given a prescription to hold, with an expiration date)  Follow-up with family doctor if not improving about one week.Marland Kitchen.     Lattie HawStephen A Bich Mchaney, MD 10/16/15 [phone]

## 2015-10-08 NOTE — ED Notes (Signed)
Pt c/o getting a cold starting memorial day weekend, about 1 months ago, 1 1/2 weeks later developed a GI virus with fever lasting 5 days, 5 days ago sore throat then became hoarse, Since, she has thick yellow sputum/discharge. She is currently trying to conceive and is mid-cycle. Taken Delsym and Mucinex D@ home otc.

## 2015-10-08 NOTE — Discharge Instructions (Signed)
Take plain guaifenesin (1200mg  extended release tabs such as Mucinex) twice daily, with plenty of water, for cough and congestion.  May add Pseudoephedrine (30mg , one or two every 4 to 6 hours) for sinus congestion.  Get adequate rest.   May use Afrin nasal spray (or generic oxymetazoline) twice daily for about 5 days and then discontinue.  Also recommend using saline nasal spray several times daily and saline nasal irrigation (AYR is a common brand).  Use Flonase nasal spray each morning after using Afrin nasal spray and saline nasal irrigation. Try warm salt water gargles for sore throat.  May take Delsym Cough Suppressant at bedtime for nighttime cough.  Stop all antihistamines for now, and other non-prescription cough/cold preparations. Begin Amoxicillin if not improving about 5 days or if persistent fever develops   Follow-up with family doctor if not improving about one week..Marland Kitchen

## 2016-12-12 ENCOUNTER — Other Ambulatory Visit (HOSPITAL_COMMUNITY): Payer: Self-pay | Admitting: Obstetrics & Gynecology

## 2016-12-12 DIAGNOSIS — Z3141 Encounter for fertility testing: Secondary | ICD-10-CM

## 2016-12-19 ENCOUNTER — Ambulatory Visit (HOSPITAL_COMMUNITY)
Admission: RE | Admit: 2016-12-19 | Discharge: 2016-12-19 | Disposition: A | Discharge status: Home / Self Care | Payer: Managed Care, Other (non HMO) | Source: Ambulatory Visit | Attending: Obstetrics & Gynecology | Admitting: Obstetrics & Gynecology

## 2016-12-19 DIAGNOSIS — Z3141 Encounter for fertility testing: Secondary | ICD-10-CM | POA: Insufficient documentation

## 2016-12-19 MED ORDER — IOPAMIDOL (ISOVUE-300) INJECTION 61%
30.0000 mL | Freq: Once | INTRAVENOUS | Status: AC | PRN
  Administered 2016-12-19: 30 mL

## 2017-10-12 ENCOUNTER — Encounter (HOSPITAL_COMMUNITY)
Admission: AD | Disposition: A | Discharge status: Home / Self Care | Payer: Self-pay | Source: Ambulatory Visit | Attending: Obstetrics & Gynecology

## 2017-10-12 ENCOUNTER — Other Ambulatory Visit: Payer: Self-pay

## 2017-10-12 ENCOUNTER — Ambulatory Visit (HOSPITAL_COMMUNITY): Payer: Managed Care, Other (non HMO) | Admitting: Certified Registered Nurse Anesthetist

## 2017-10-12 ENCOUNTER — Inpatient Hospital Stay (HOSPITAL_COMMUNITY)
Admission: AD | Admit: 2017-10-12 | Payer: BLUE CROSS/BLUE SHIELD | Source: Ambulatory Visit | Admitting: Obstetrics & Gynecology

## 2017-10-12 ENCOUNTER — Encounter (HOSPITAL_COMMUNITY): Payer: Self-pay

## 2017-10-12 ENCOUNTER — Ambulatory Visit (HOSPITAL_COMMUNITY)
Admission: AD | Admit: 2017-10-12 | Discharge: 2017-10-12 | Disposition: A | Discharge status: Home / Self Care | Payer: Managed Care, Other (non HMO) | Source: Ambulatory Visit | Attending: Obstetrics & Gynecology | Admitting: Obstetrics & Gynecology

## 2017-10-12 DIAGNOSIS — O021 Missed abortion: Secondary | ICD-10-CM | POA: Diagnosis present

## 2017-10-12 HISTORY — PX: DILATION AND EVACUATION: SHX1459

## 2017-10-12 LAB — TYPE AND SCREEN
ABO/RH(D): A POS
Antibody Screen: NEGATIVE

## 2017-10-12 LAB — CBC
HEMATOCRIT: 40.5 % (ref 36.0–46.0)
Hemoglobin: 14.3 g/dL (ref 12.0–15.0)
MCH: 28 pg (ref 26.0–34.0)
MCHC: 35.3 g/dL (ref 30.0–36.0)
MCV: 79.3 fL (ref 78.0–100.0)
Platelets: 261 10*3/uL (ref 150–400)
RBC: 5.11 MIL/uL (ref 3.87–5.11)
RDW: 14 % (ref 11.5–15.5)
WBC: 12.3 10*3/uL — ABNORMAL HIGH (ref 4.0–10.5)

## 2017-10-12 SURGERY — DILATION AND EVACUATION, UTERUS
Anesthesia: General | Site: Vagina

## 2017-10-12 MED ORDER — LIDOCAINE 2% (20 MG/ML) 5 ML SYRINGE
INTRAMUSCULAR | Status: DC | PRN
  Administered 2017-10-12: 40 mg via INTRAVENOUS

## 2017-10-12 MED ORDER — PROPOFOL 10 MG/ML IV BOLUS
INTRAVENOUS | Status: AC
  Filled 2017-10-12: qty 20

## 2017-10-12 MED ORDER — CHLOROPROCAINE HCL 1 % IJ SOLN
INTRAMUSCULAR | Status: DC | PRN
  Administered 2017-10-12: 10 mL

## 2017-10-12 MED ORDER — FENTANYL CITRATE (PF) 100 MCG/2ML IJ SOLN
INTRAMUSCULAR | Status: AC
  Filled 2017-10-12: qty 2

## 2017-10-12 MED ORDER — SODIUM CHLORIDE 0.9 % IV SOLN
100.0000 mg | Freq: Once | INTRAVENOUS | Status: AC
  Administered 2017-10-12: 100 mg via INTRAVENOUS
  Filled 2017-10-12: qty 100

## 2017-10-12 MED ORDER — PROPOFOL 500 MG/50ML IV EMUL
INTRAVENOUS | Status: DC | PRN
  Administered 2017-10-12: 75 ug/kg/min via INTRAVENOUS

## 2017-10-12 MED ORDER — DOXYCYCLINE HYCLATE 100 MG PO TABS
ORAL_TABLET | ORAL | Status: AC
  Administered 2017-10-12: 200 mg via ORAL
  Filled 2017-10-12: qty 2

## 2017-10-12 MED ORDER — ONDANSETRON HCL 4 MG/2ML IJ SOLN
INTRAMUSCULAR | Status: DC | PRN
  Administered 2017-10-12: 4 mg via INTRAVENOUS

## 2017-10-12 MED ORDER — OXYCODONE HCL 5 MG/5ML PO SOLN: 5.0000 mg | Freq: Once | ORAL | Status: DC | PRN

## 2017-10-12 MED ORDER — KETOROLAC TROMETHAMINE 30 MG/ML IJ SOLN
INTRAMUSCULAR | Status: AC
  Filled 2017-10-12: qty 1

## 2017-10-12 MED ORDER — LIDOCAINE HCL (CARDIAC) PF 100 MG/5ML IV SOSY
PREFILLED_SYRINGE | INTRAVENOUS | Status: AC
  Filled 2017-10-12: qty 5

## 2017-10-12 MED ORDER — DOXYCYCLINE HYCLATE 100 MG PO TABS
200.0000 mg | ORAL_TABLET | Freq: Once | ORAL | Status: AC
  Administered 2017-10-12: 200 mg via ORAL

## 2017-10-12 MED ORDER — PROMETHAZINE HCL 25 MG/ML IJ SOLN: 6.2500 mg | INTRAMUSCULAR | Status: DC | PRN

## 2017-10-12 MED ORDER — ACETAMINOPHEN 500 MG PO TABS
1000.0000 mg | ORAL_TABLET | Freq: Once | ORAL | Status: AC
  Administered 2017-10-12: 1000 mg via ORAL

## 2017-10-12 MED ORDER — OXYCODONE-ACETAMINOPHEN 5-325 MG PO TABS: 1.0000 | ORAL_TABLET | ORAL | 0 refills | Status: AC | PRN

## 2017-10-12 MED ORDER — SCOPOLAMINE 1 MG/3DAYS TD PT72
MEDICATED_PATCH | TRANSDERMAL | Status: AC
  Administered 2017-10-12: 1.5 mg via TRANSDERMAL
  Filled 2017-10-12: qty 1

## 2017-10-12 MED ORDER — HYDROMORPHONE HCL 1 MG/ML IJ SOLN: 0.2500 mg | INTRAMUSCULAR | Status: DC | PRN

## 2017-10-12 MED ORDER — OXYCODONE HCL 5 MG PO TABS: 5.0000 mg | ORAL_TABLET | Freq: Once | ORAL | Status: DC | PRN

## 2017-10-12 MED ORDER — MIDAZOLAM HCL 2 MG/2ML IJ SOLN
INTRAMUSCULAR | Status: DC | PRN
  Administered 2017-10-12: 2 mg via INTRAVENOUS

## 2017-10-12 MED ORDER — SCOPOLAMINE 1 MG/3DAYS TD PT72
1.0000 | MEDICATED_PATCH | Freq: Once | TRANSDERMAL | Status: DC
  Administered 2017-10-12: 1.5 mg via TRANSDERMAL

## 2017-10-12 MED ORDER — CELECOXIB 200 MG PO CAPS
200.0000 mg | ORAL_CAPSULE | Freq: Once | ORAL | Status: AC
  Administered 2017-10-12: 200 mg via ORAL

## 2017-10-12 MED ORDER — PROPOFOL 10 MG/ML IV BOLUS
INTRAVENOUS | Status: DC | PRN
  Administered 2017-10-12 (×3): 40 mg via INTRAVENOUS
  Administered 2017-10-12 (×3): 20 mg via INTRAVENOUS
  Administered 2017-10-12: 40 mg via INTRAVENOUS
  Administered 2017-10-12: 20 mg via INTRAVENOUS

## 2017-10-12 MED ORDER — IBUPROFEN 800 MG PO TABS: 800.0000 mg | ORAL_TABLET | Freq: Four times a day (QID) | ORAL | 0 refills | Status: AC | PRN

## 2017-10-12 MED ORDER — DEXAMETHASONE SODIUM PHOSPHATE 10 MG/ML IJ SOLN
INTRAMUSCULAR | Status: AC
Start: 2017-10-12 — End: ?
  Filled 2017-10-12: qty 1

## 2017-10-12 MED ORDER — DEXAMETHASONE SODIUM PHOSPHATE 10 MG/ML IJ SOLN
INTRAMUSCULAR | Status: DC | PRN
  Administered 2017-10-12: 10 mg via INTRAVENOUS

## 2017-10-12 MED ORDER — LACTATED RINGERS IV SOLN: INTRAVENOUS | Status: DC

## 2017-10-12 MED ORDER — MIDAZOLAM HCL 2 MG/2ML IJ SOLN
INTRAMUSCULAR | Status: AC
  Filled 2017-10-12: qty 2

## 2017-10-12 MED ORDER — ONDANSETRON HCL 4 MG/2ML IJ SOLN
INTRAMUSCULAR | Status: AC
  Filled 2017-10-12: qty 2

## 2017-10-12 MED ORDER — FENTANYL CITRATE (PF) 100 MCG/2ML IJ SOLN
INTRAMUSCULAR | Status: DC | PRN
  Administered 2017-10-12 (×2): 25 ug via INTRAVENOUS
  Administered 2017-10-12: 50 ug via INTRAVENOUS

## 2017-10-12 MED ORDER — LACTATED RINGERS IV SOLN
INTRAVENOUS | Status: DC
  Administered 2017-10-12 (×2): 125 mL/h via INTRAVENOUS

## 2017-10-12 MED ORDER — CHLOROPROCAINE HCL 1 % IJ SOLN
INTRAMUSCULAR | Status: AC
  Filled 2017-10-12: qty 30

## 2017-10-12 SURGICAL SUPPLY — 19 items
CATH ROBINSON RED A/P 16FR (CATHETERS) ×3 IMPLANT
DECANTER SPIKE VIAL GLASS SM (MISCELLANEOUS) ×3 IMPLANT
GLOVE BIO SURGEON STRL SZ 6 (GLOVE) ×3 IMPLANT
GLOVE BIOGEL PI IND STRL 6 (GLOVE) ×1 IMPLANT
GLOVE BIOGEL PI IND STRL 7.0 (GLOVE) ×1 IMPLANT
GLOVE BIOGEL PI INDICATOR 6 (GLOVE) ×2
GLOVE BIOGEL PI INDICATOR 7.0 (GLOVE) ×2
GOWN STRL REUS W/TWL LRG LVL3 (GOWN DISPOSABLE) ×6 IMPLANT
KIT BERKELEY 1ST TRIMESTER 3/8 (MISCELLANEOUS) ×3 IMPLANT
NS IRRIG 1000ML POUR BTL (IV SOLUTION) ×3 IMPLANT
PACK VAGINAL MINOR WOMEN LF (CUSTOM PROCEDURE TRAY) ×3 IMPLANT
PAD OB MATERNITY 4.3X12.25 (PERSONAL CARE ITEMS) ×3 IMPLANT
PAD PREP 24X48 CUFFED NSTRL (MISCELLANEOUS) ×3 IMPLANT
SET BERKELEY SUCTION TUBING (SUCTIONS) ×3 IMPLANT
TOWEL OR 17X24 6PK STRL BLUE (TOWEL DISPOSABLE) ×6 IMPLANT
VACURETTE 10 RIGID CVD (CANNULA) IMPLANT
VACURETTE 7MM CVD STRL WRAP (CANNULA) IMPLANT
VACURETTE 8 RIGID CVD (CANNULA) IMPLANT
VACURETTE 9 RIGID CVD (CANNULA) ×3 IMPLANT

## 2017-10-12 NOTE — Anesthesia Postprocedure Evaluation (Signed)
Anesthesia Post Note  Patient: Clayborne Dana  Procedure(s) Performed: DILATATION AND EVACUATION (N/A Vagina )     Patient location during evaluation: PACU Anesthesia Type: MAC Level of consciousness: awake and alert Pain management: pain level controlled Vital Signs Assessment: post-procedure vital signs reviewed and stable Respiratory status: spontaneous breathing, nonlabored ventilation, respiratory function stable and patient connected to nasal cannula oxygen Cardiovascular status: stable and blood pressure returned to baseline Postop Assessment: no apparent nausea or vomiting Anesthetic complications: no    Last Vitals:  Vitals:   10/12/17 1615 10/12/17 1630  BP: 111/70 119/69  Pulse: 70 73  Resp: 12 13  Temp:  36.9 C  SpO2: 100% 100%    Last Pain:  Vitals:   10/12/17 1630  TempSrc:   PainSc: 0-No pain   Pain Goal: Patients Stated Pain Goal: 2 (10/12/17 1527)               Thurmond Butts P Ellender

## 2017-10-12 NOTE — Progress Notes (Signed)
No change to H&P. Pt declines genetic studies.  Mitchel HonourMegan Vanesa Renier, DO

## 2017-10-12 NOTE — H&P (Signed)
Claire Butler is an 37 y.o. female with missed ab or twin pregnancy at 8 weeks.  She has tried misoprostol x 3 without success.  Blood type is A pos.    Pertinent Gynecological History: Menses: n/a Bleeding: n/a Contraception: none DES exposure: unknown Blood transfusions: none Sexually transmitted diseases: no past history Previous GYN Procedures: n/a  Last mammogram: n/a Date: n/a Last pap: normal Date: 2017   OB History: G2, P1011   Menstrual History: Menarche age: n/a No LMP recorded.    Past Medical History:  Diagnosis Date  . Headache   . Hx of varicella     Past Surgical History:  Procedure Laterality Date  . EYE MUSCLE SURGERY    . WISDOM TOOTH EXTRACTION      Family History  Problem Relation Age of Onset  . Depression Sister   . Cancer Maternal Grandmother        breast  . Cancer Paternal Grandmother        lung    Social History:  reports that she has never smoked. She has never used smokeless tobacco. She reports that she does not drink alcohol or use drugs.  Allergies: No Known Allergies  Medications Prior to Admission  Medication Sig Dispense Refill Last Dose  . loratadine (CLARITIN) 10 MG tablet Take 10 mg by mouth daily.   10/12/2017 at 0800  . misoprostol (CYTOTEC) 200 MCG tablet Place 800 mcg vaginally once. Pt had this procedure two other times     . oxyCODONE-acetaminophen (PERCOCET/ROXICET) 5-325 MG tablet Take 1 tablet by mouth every 4 (four) hours as needed for severe pain.   10/11/2017 at Unknown time  . Prenat-FeFum-FePo-FA-Omega 3 (CONCEPT DHA PO) Take 1 capsule by mouth daily.    10/09/2017 at Unknown time    ROS  Blood pressure 127/84, pulse (!) 104, temperature 98.2 F (36.8 C), temperature source Oral, resp. rate 16, SpO2 100 %, unknown if currently breastfeeding. Physical Exam  Constitutional: She is oriented to person, place, and time. She appears well-developed and well-nourished.  GI: Soft. There is no rebound and no guarding.   Neurological: She is alert and oriented to person, place, and time.  Skin: Skin is warm and dry.  Psychiatric: She has a normal mood and affect. Her behavior is normal.    Results for orders placed or performed during the hospital encounter of 10/12/17 (from the past 24 hour(s))  CBC     Status: Abnormal   Collection Time: 10/12/17  1:00 PM  Result Value Ref Range   WBC 12.3 (H) 4.0 - 10.5 K/uL   RBC 5.11 3.87 - 5.11 MIL/uL   Hemoglobin 14.3 12.0 - 15.0 g/dL   HCT 40.940.5 81.136.0 - 91.446.0 %   MCV 79.3 78.0 - 100.0 fL   MCH 28.0 26.0 - 34.0 pg   MCHC 35.3 30.0 - 36.0 g/dL   RDW 78.214.0 95.611.5 - 21.315.5 %   Platelets 261 150 - 400 K/uL    No results found.  Assessment/Plan: 37yo G2P1011 with missed ab at 8 weeks. -Suction D&C -Patient has been counseled re: risk of bleeding, infection, scarring, and damage to surrounding structures. All questions were answered and the patient wishes to proceed.     Hasani Diemer 10/12/2017, 1:44 PM

## 2017-10-12 NOTE — Anesthesia Preprocedure Evaluation (Addendum)
Anesthesia Evaluation  Patient identified by MRN, date of birth, ID band Patient awake    Reviewed: Allergy & Precautions, NPO status , Patient's Chart, lab work & pertinent test results  Airway Mallampati: I  TM Distance: >3 FB Neck ROM: Full    Dental no notable dental hx.    Pulmonary neg pulmonary ROS,    Pulmonary exam normal breath sounds clear to auscultation       Cardiovascular negative cardio ROS Normal cardiovascular exam Rhythm:Regular Rate:Normal     Neuro/Psych  Headaches, negative psych ROS   GI/Hepatic negative GI ROS, Neg liver ROS,   Endo/Other  negative endocrine ROS  Renal/GU negative Renal ROS     Musculoskeletal negative musculoskeletal ROS (+)   Abdominal   Peds  Hematology negative hematology ROS (+)   Anesthesia Other Findings Missed AB Twins at 8 weeks   Reproductive/Obstetrics                            Anesthesia Physical Anesthesia Plan  ASA: II  Anesthesia Plan: MAC   Post-op Pain Management:    Induction: Intravenous  PONV Risk Score and Plan: 3 and Ondansetron, Dexamethasone, Midazolam, Scopolamine patch - Pre-op and Treatment may vary due to age or medical condition  Airway Management Planned: Natural Airway  Additional Equipment:   Intra-op Plan:   Post-operative Plan:   Informed Consent: I have reviewed the patients History and Physical, chart, labs and discussed the procedure including the risks, benefits and alternatives for the proposed anesthesia with the patient or authorized representative who has indicated his/her understanding and acceptance.   Dental advisory given  Plan Discussed with: CRNA  Anesthesia Plan Comments:       Anesthesia Quick Evaluation

## 2017-10-12 NOTE — Op Note (Signed)
PREOPERATIVE DIAGNOSIS: 37yo withmissed abortion, twin pregnancy  POSTOPERATIVE DIAGNOSIS: The same  PROCEDURE: Suction Dilation and Evacuation   SURGEON: Dr. Mitchel HonourMegan Ziaire Bieser  INDICATIONS:37y.o. here for scheduled surgery for treatment of retained products of conception after misoprostol treatment ofmissed abortion. Risks of surgery were discussed with the patient including but not limited to: bleeding which may require transfusion; infection which may require antibiotics; injury to uterus or surrounding organs; intrauterine scarring which may impair future fertility; need for additional procedures including laparotomy or laparoscopy; and other postoperative/anesthesia complications. Written informed consent was obtained.   FINDINGS: A 10week sized uterus   ANESTHESIA:conscious sedation  ESTIMATED BLOOD LOSS:50cc  SPECIMENS: POC sent to pathology   COMPLICATIONS: None immediate.  PROCEDURE DETAILS: The patient was taken to the operating room where general anesthesiawas administered.After an adequate timeout was performed, she was placed in the dorsal lithotomy position and examined; then prepped and draped in the sterile manner. Her bladder was catheterized for an unmeasured amount of clear, yellow urine. A speculum was then placed in the patient's vagina and a single tooth tenaculum was applied to the anterior lip of the cervix.The uterus was dilated manually with metal dilators to accommodate the 9mm suction curette. Once the cervix was dilated, the suction curette was advanced without difficulty. Suction curettage was performed x 2 passes.Sharp curettage was performed and a gritty texture was appreciated globally.  A final suction curettage was performed. The tenaculum was removed from the anterior lip of the cervix. The vaginal speculum was removed after noting good hemostasis. The patient tolerated the procedure well and was taken to the recovery area  awake, extubated and in stable condition.

## 2017-10-12 NOTE — Discharge Instructions (Signed)
DISCHARGE INSTRUCTIONS: D&C / D&E The following instructions have been prepared to help you care for yourself upon your return home.   Personal hygiene:  Use sanitary pads for vaginal drainage, not tampons.  Shower the day after your procedure.  NO tub baths, pools or Jacuzzis for 2-3 weeks.  Wipe front to back after using the bathroom.  Activity and limitations:  Do NOT drive or operate any equipment for 24 hours. The effects of anesthesia are still present and drowsiness may result.  Do NOT rest in bed all day.  Walking is encouraged.  Walk up and down stairs slowly.  You may resume your normal activity in one to two days or as indicated by your physician.  Sexual activity: NO intercourse for at least 2 weeks after the procedure, or as indicated by your physician.  Diet: Eat a light meal as desired this evening. You may resume your usual diet tomorrow.  Return to work: You may resume your work activities in one to two days or as indicated by your doctor.  What to expect after your surgery: Expect to have vaginal bleeding/discharge for 2-3 days and spotting for up to 10 days. It is not unusual to have soreness for up to 1-2 weeks. You may have a slight burning sensation when you urinate for the first day. Mild cramps may continue for a couple of days. You may have a regular period in 2-6 weeks.  Call your doctor for any of the following:  Excessive vaginal bleeding, saturating and changing one pad every hour.  Inability to urinate 6 hours after discharge from hospital.  Pain not relieved by pain medication.  Fever of 100.4 F or greater.  Unusual vaginal discharge or odor.   Call for an appointment:    Patients signature: ______________________  Nurses signature ________________________  Support person's signature_______________________    Post Anesthesia Home Care Instructions  Activity: Get plenty of rest for the remainder of the day. A responsible  individual must stay with you for 24 hours following the procedure.  For the next 24 hours, DO NOT: -Drive a car -Advertising copywriter -Drink alcoholic beverages -Take any medication unless instructed by your physician -Make any legal decisions or sign important papers.  Meals: Start with liquid foods such as gelatin or soup. Progress to regular foods as tolerated. Avoid greasy, spicy, heavy foods. If nausea and/or vomiting occur, drink only clear liquids until the nausea and/or vomiting subsides. Call your physician if vomiting continues.  Special Instructions/Symptoms: Your throat may feel dry or sore from the anesthesia or the breathing tube placed in your throat during surgery. If this causes discomfort, gargle with warm salt water. The discomfort should disappear within 24 hours.  If you had a scopolamine patch placed behind your ear for the management of post- operative nausea and/or vomiting:  1. The medication in the patch is effective for 72 hours, after which it should be removed.  Wrap patch in a tissue and discard in the trash. Wash hands thoroughly with soap and water. 2. You may remove the patch earlier than 72 hours if you experience unpleasant side effects which may include dry mouth, dizziness or visual disturbances. 3. Avoid touching the patch. Wash your hands with soap and water after contact with the patch.   Call MD for T>100.4, heavy vaginal bleeding, severe abdominal pain, intractable nausea and/or vomiting, or respiratory distress.  Call office to schedule postop appointment in 2 weeks.  Pelvic rest x 2 weeks.  No driving  while taking narcotics.

## 2017-10-12 NOTE — Transfer of Care (Signed)
Immediate Anesthesia Transfer of Care Note  Patient: Claire Butler  Procedure(s) Performed: DILATATION AND EVACUATION (N/A Vagina )  Patient Location: PACU  Anesthesia Type:MAC  Level of Consciousness: awake, alert , oriented and patient cooperative  Airway & Oxygen Therapy: Patient Spontanous Breathing and Patient connected to nasal cannula oxygen  Post-op Assessment: Report given to RN and Post -op Vital signs reviewed and stable  Post vital signs: Reviewed and stable  Last Vitals:  Vitals Value Taken Time  BP 102/64 10/12/2017  3:30 PM  Temp    Pulse 69 10/12/2017  3:31 PM  Resp 18 10/12/2017  3:31 PM  SpO2 100 % 10/12/2017  3:31 PM  Vitals shown include unvalidated device data.  Last Pain:  Vitals:   10/12/17 1336  TempSrc: Oral  PainSc: 0-No pain      Patients Stated Pain Goal: 2 (19/50/93 2671)  Complications: No apparent anesthesia complications

## 2017-10-13 ENCOUNTER — Encounter (HOSPITAL_COMMUNITY): Payer: Self-pay | Admitting: Obstetrics & Gynecology

## 2018-08-26 DIAGNOSIS — Z0000 Encounter for general adult medical examination without abnormal findings: Secondary | ICD-10-CM | POA: Diagnosis not present

## 2018-08-26 DIAGNOSIS — Z13220 Encounter for screening for lipoid disorders: Secondary | ICD-10-CM | POA: Diagnosis not present

## 2020-07-15 ENCOUNTER — Other Ambulatory Visit: Payer: Self-pay | Admitting: Obstetrics & Gynecology

## 2020-07-15 DIAGNOSIS — R928 Other abnormal and inconclusive findings on diagnostic imaging of breast: Secondary | ICD-10-CM

## 2020-07-16 ENCOUNTER — Ambulatory Visit
Admission: RE | Admit: 2020-07-16 | Discharge: 2020-07-16 | Disposition: A | Discharge status: Home / Self Care | Payer: Managed Care, Other (non HMO) | Source: Ambulatory Visit | Attending: Obstetrics & Gynecology | Admitting: Obstetrics & Gynecology

## 2020-07-16 ENCOUNTER — Other Ambulatory Visit: Payer: Self-pay

## 2020-07-16 ENCOUNTER — Ambulatory Visit: Payer: Managed Care, Other (non HMO)

## 2020-07-16 DIAGNOSIS — R928 Other abnormal and inconclusive findings on diagnostic imaging of breast: Secondary | ICD-10-CM

## 2021-06-30 ENCOUNTER — Other Ambulatory Visit: Payer: Self-pay | Admitting: Obstetrics & Gynecology

## 2021-07-19 ENCOUNTER — Ambulatory Visit
Admission: RE | Admit: 2021-07-19 | Discharge: 2021-07-19 | Disposition: A | Discharge status: Home / Self Care | Payer: Self-pay | Source: Ambulatory Visit | Attending: Obstetrics & Gynecology | Admitting: Obstetrics & Gynecology

## 2021-07-19 DIAGNOSIS — Z1231 Encounter for screening mammogram for malignant neoplasm of breast: Secondary | ICD-10-CM

## 2022-06-23 ENCOUNTER — Other Ambulatory Visit: Payer: Self-pay | Admitting: Obstetrics & Gynecology

## 2022-06-23 DIAGNOSIS — Z1231 Encounter for screening mammogram for malignant neoplasm of breast: Secondary | ICD-10-CM

## 2022-08-04 ENCOUNTER — Ambulatory Visit
Admission: RE | Admit: 2022-08-04 | Discharge: 2022-08-04 | Disposition: A | Discharge status: Home / Self Care | Payer: Commercial Managed Care - PPO | Source: Ambulatory Visit | Attending: Obstetrics & Gynecology | Admitting: Obstetrics & Gynecology

## 2022-08-04 DIAGNOSIS — Z1231 Encounter for screening mammogram for malignant neoplasm of breast: Secondary | ICD-10-CM

## 2023-06-21 ENCOUNTER — Encounter: Payer: Self-pay | Admitting: Obstetrics & Gynecology

## 2023-07-16 ENCOUNTER — Other Ambulatory Visit: Payer: Self-pay | Admitting: Obstetrics & Gynecology

## 2023-07-16 DIAGNOSIS — Z0000 Encounter for general adult medical examination without abnormal findings: Secondary | ICD-10-CM

## 2023-08-14 ENCOUNTER — Ambulatory Visit
Admission: RE | Admit: 2023-08-14 | Discharge: 2023-08-14 | Disposition: A | Discharge status: Home / Self Care | Source: Ambulatory Visit | Attending: Family Medicine | Admitting: Family Medicine

## 2023-08-14 DIAGNOSIS — Z0000 Encounter for general adult medical examination without abnormal findings: Secondary | ICD-10-CM

## 2023-09-10 IMAGING — MG MM DIGITAL SCREENING BILAT W/ TOMO AND CAD
8 series · 8 of 24 positions shown · non-contrast
Comparison: Previous exam(s).

CLINICAL DATA: Screening.

EXAM:
DIGITAL SCREENING BILATERAL MAMMOGRAM WITH TOMOSYNTHESIS AND CAD
TECHNIQUE: Bilateral screening digital craniocaudal and mediolateral oblique
mammograms were obtained. Bilateral screening digital breast
tomosynthesis was performed. The images were evaluated with
computer-aided detection.

[R MLO synth-2D]
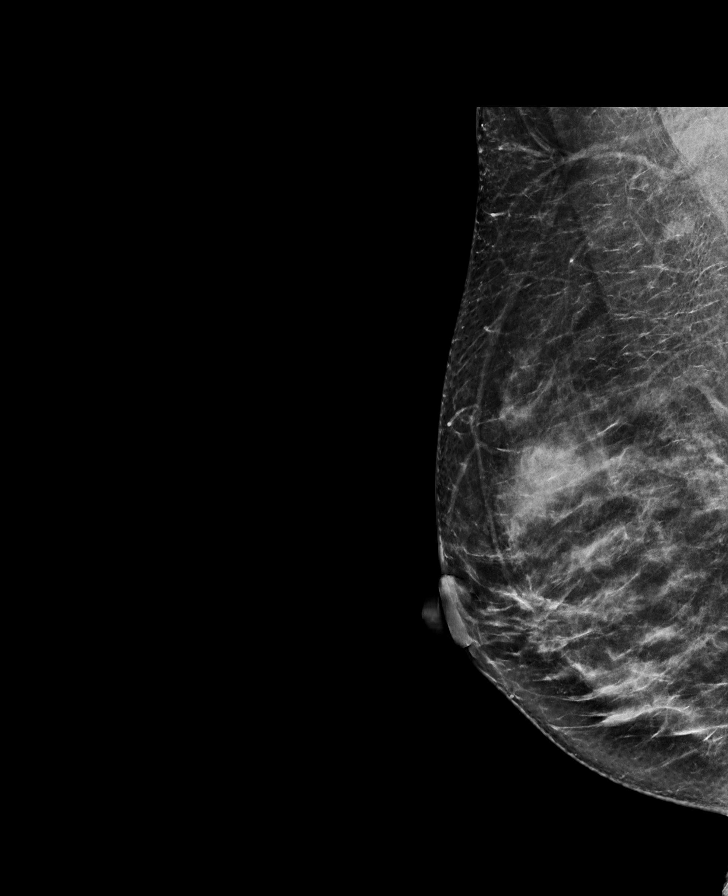

[L CC synth-2D]
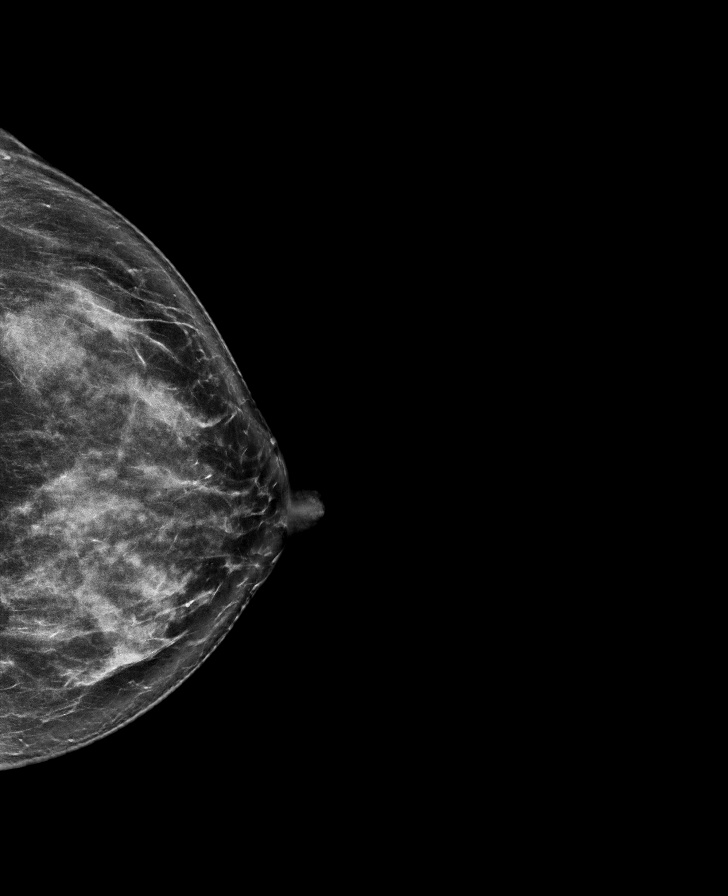

[L MLO synth-2D]
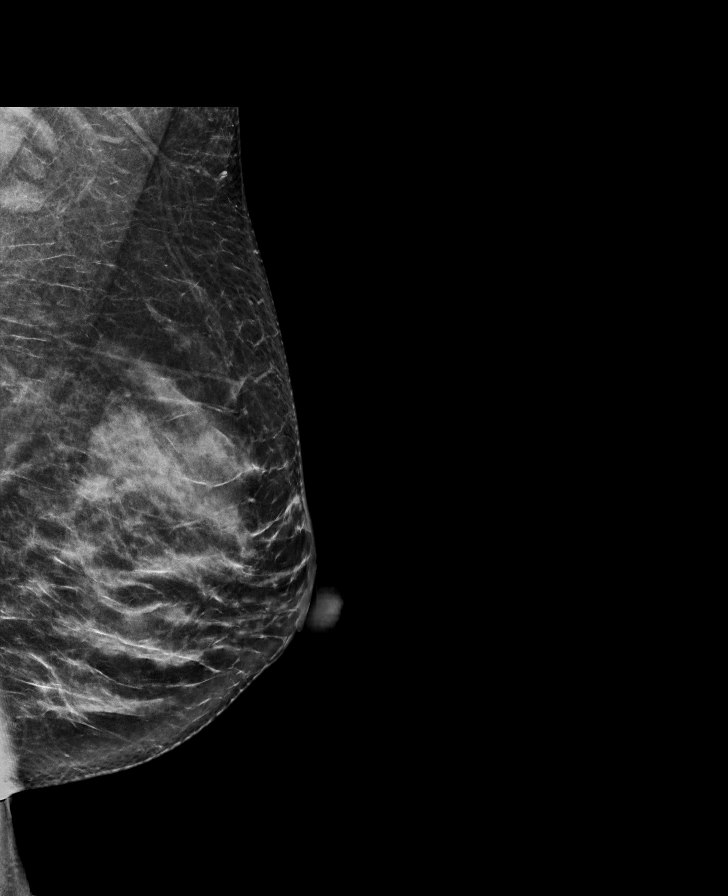

[R CC synth-2D]
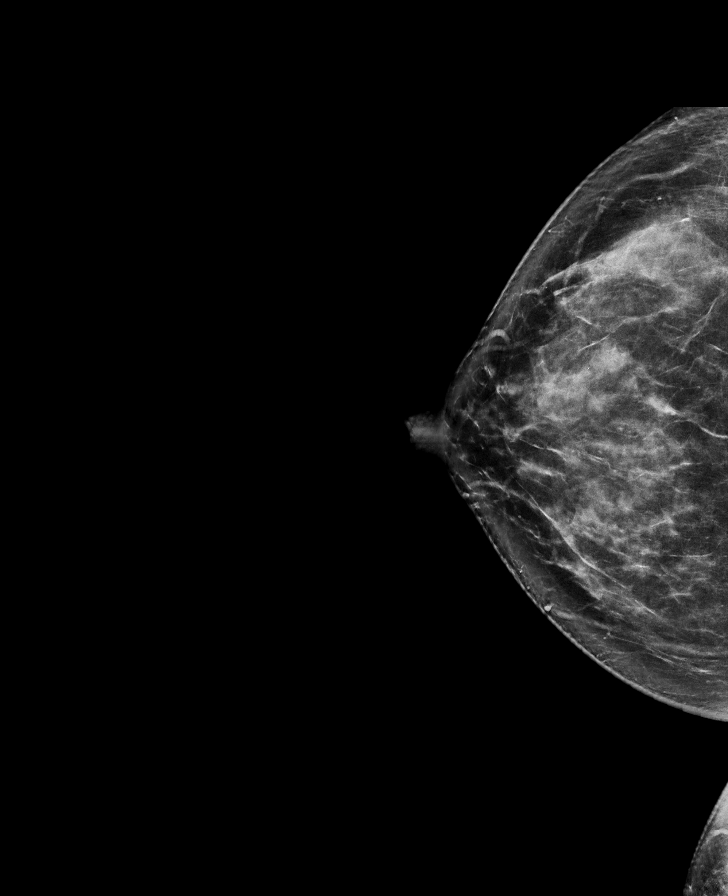

[L MLO tomo · tomo slice 40/79.0]
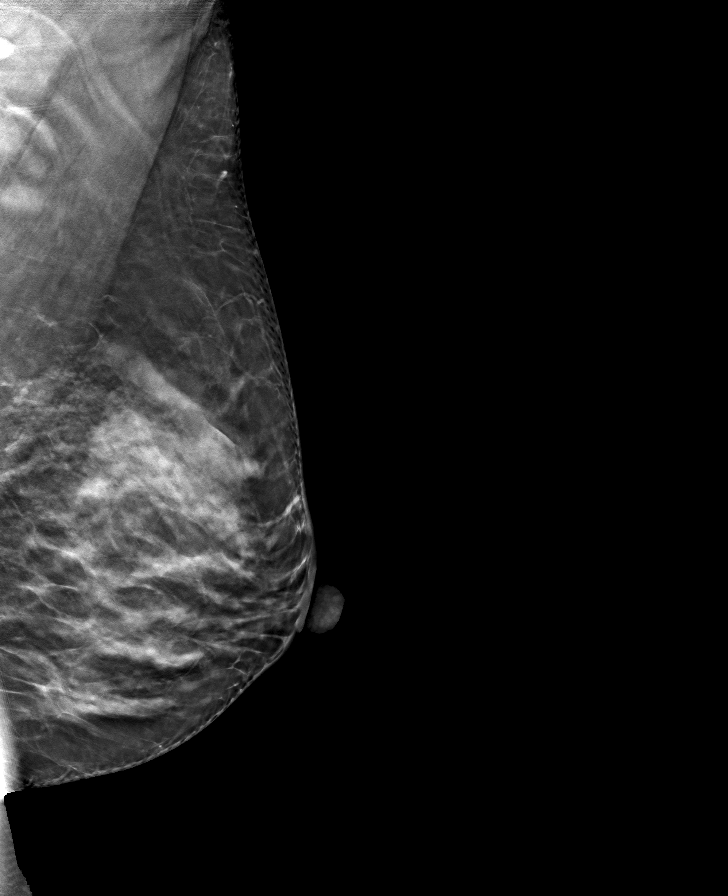

[L CC tomo · tomo slice 43/84.0]
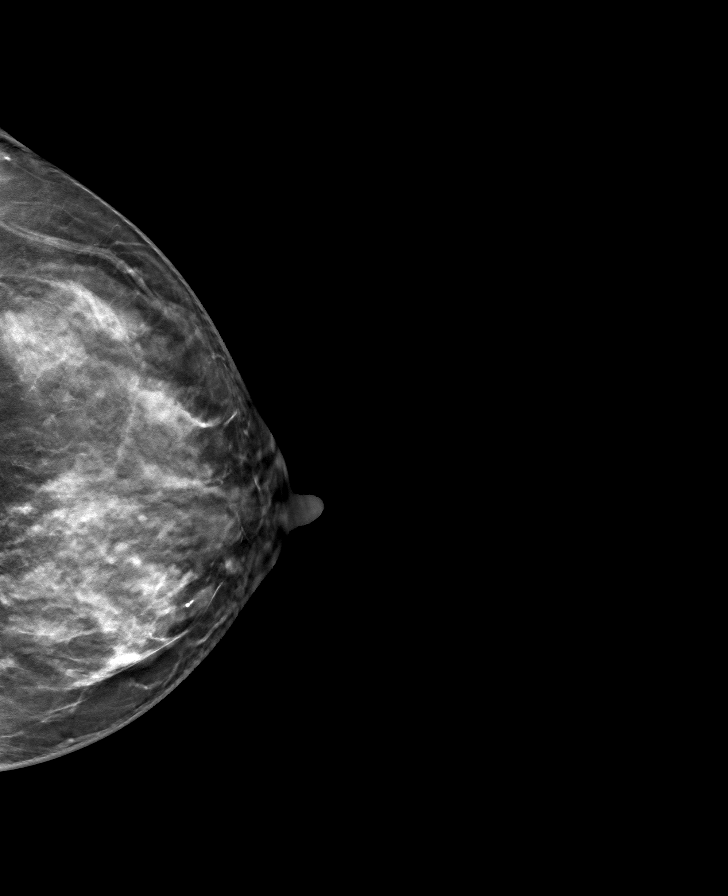

[R MLO tomo · tomo slice 41/81.0]
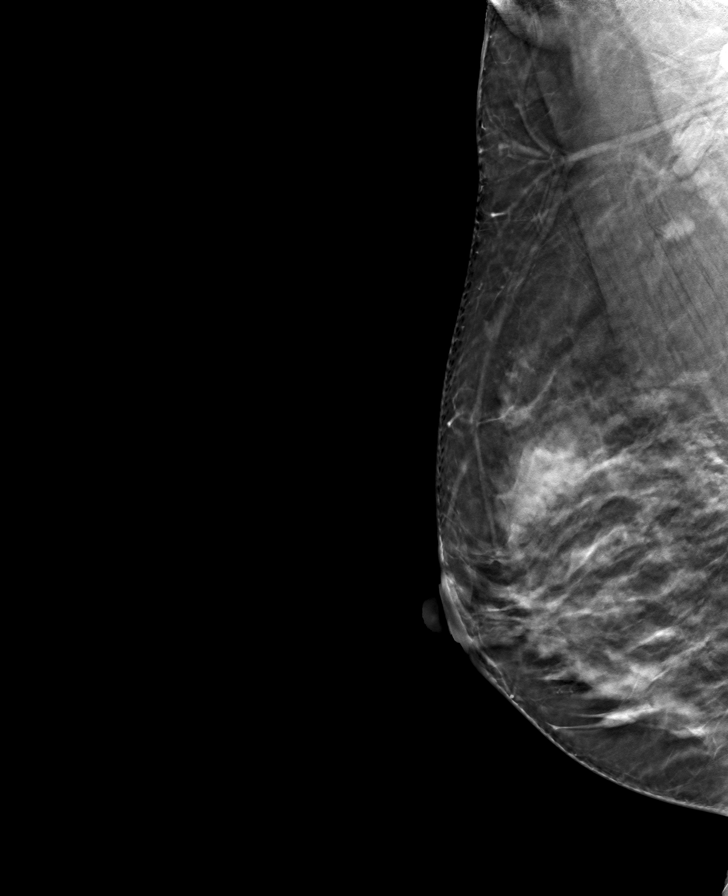

[R CC tomo · tomo slice 42/83.0]
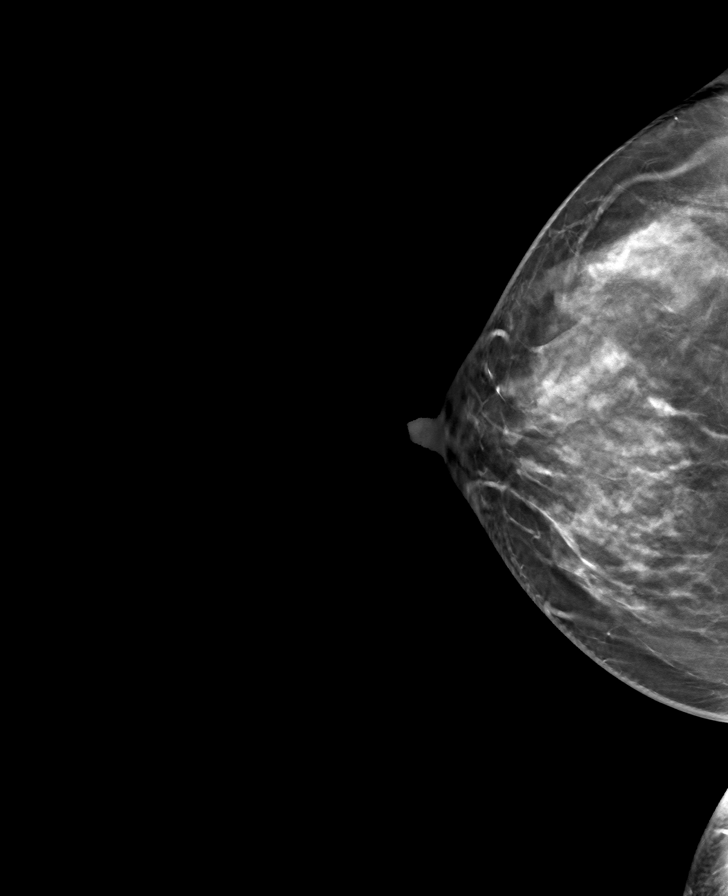

[8 of 24 positions shown; findings below may reference images not displayed]

ACR Breast Density Category c: The breast tissue is heterogeneously
dense, which may obscure small masses.
FINDINGS: There are no findings suspicious for malignancy.
IMPRESSION: No mammographic evidence of malignancy. A result letter of this
screening mammogram will be mailed directly to the patient.

RECOMMENDATION:
Screening mammogram in one year. (Code:Q3-W-BC3)

BI-RADS CATEGORY  1: Negative.
